# Patient Record
Sex: Female | Born: 1945 | Race: Black or African American | Hispanic: No | Marital: Single | Smoking: Never smoker
Health system: Southern US, Community
[De-identification: ages and names within clinical notes are randomized; demographics above are authoritative.]

## PROBLEM LIST (undated history)

## (undated) DIAGNOSIS — E119 Type 2 diabetes mellitus without complications: Secondary | ICD-10-CM

---

## 2017-08-17 ENCOUNTER — Encounter: Payer: Self-pay | Admitting: Neurology

## 2021-06-15 ENCOUNTER — Emergency Department (HOSPITAL_BASED_OUTPATIENT_CLINIC_OR_DEPARTMENT_OTHER): Payer: Medicare Other

## 2021-06-15 ENCOUNTER — Other Ambulatory Visit: Payer: Self-pay

## 2021-06-15 ENCOUNTER — Encounter (HOSPITAL_BASED_OUTPATIENT_CLINIC_OR_DEPARTMENT_OTHER): Payer: Self-pay | Admitting: Pediatrics

## 2021-06-15 ENCOUNTER — Emergency Department (HOSPITAL_BASED_OUTPATIENT_CLINIC_OR_DEPARTMENT_OTHER)
Admission: EM | Admit: 2021-06-15 | Discharge: 2021-06-15 | Disposition: A | Discharge status: Home / Self Care | Payer: Medicare Other | Attending: Emergency Medicine | Admitting: Emergency Medicine

## 2021-06-15 DIAGNOSIS — E119 Type 2 diabetes mellitus without complications: Secondary | ICD-10-CM | POA: Diagnosis not present

## 2021-06-15 DIAGNOSIS — R42 Dizziness and giddiness: Secondary | ICD-10-CM | POA: Insufficient documentation

## 2021-06-15 HISTORY — DX: Type 2 diabetes mellitus without complications: E11.9

## 2021-06-15 LAB — CBC WITH DIFFERENTIAL/PLATELET
Abs Immature Granulocytes: 0.02 10*3/uL (ref 0.00–0.07)
Basophils Absolute: 0 10*3/uL (ref 0.0–0.1)
Basophils Relative: 0 %
Eosinophils Absolute: 0.1 10*3/uL (ref 0.0–0.5)
Eosinophils Relative: 1 %
HCT: 41.7 % (ref 36.0–46.0)
Hemoglobin: 13.3 g/dL (ref 12.0–15.0)
Immature Granulocytes: 0 %
Lymphocytes Relative: 35 %
Lymphs Abs: 2.1 10*3/uL (ref 0.7–4.0)
MCH: 28 pg (ref 26.0–34.0)
MCHC: 31.9 g/dL (ref 30.0–36.0)
MCV: 87.8 fL (ref 80.0–100.0)
Monocytes Absolute: 0.3 10*3/uL (ref 0.1–1.0)
Monocytes Relative: 6 %
Neutro Abs: 3.4 10*3/uL (ref 1.7–7.7)
Neutrophils Relative %: 58 %
Platelets: 224 10*3/uL (ref 150–400)
RBC: 4.75 MIL/uL (ref 3.87–5.11)
RDW: 13.2 % (ref 11.5–15.5)
WBC: 5.9 10*3/uL (ref 4.0–10.5)
nRBC: 0 % (ref 0.0–0.2)

## 2021-06-15 LAB — BASIC METABOLIC PANEL
Anion gap: 12 (ref 5–15)
BUN: 19 mg/dL (ref 8–23)
CO2: 23 mmol/L (ref 22–32)
Calcium: 10 mg/dL (ref 8.9–10.3)
Chloride: 103 mmol/L (ref 98–111)
Creatinine, Ser: 0.78 mg/dL (ref 0.44–1.00)
GFR, Estimated: >60 mL/min (ref >60)
Glucose, Bld: 131 mg/dL — ABNORMAL HIGH (ref 70–99)
Potassium: 3.6 mmol/L (ref 3.5–5.1)
Sodium: 138 mmol/L (ref 135–145)

## 2021-06-15 MED ORDER — MECLIZINE HCL 25 MG PO TABS: 25.0000 mg | ORAL_TABLET | Freq: Two times a day (BID) | ORAL | 0 refills | Status: AC | PRN

## 2021-06-15 NOTE — ED Provider Notes (Signed)
[facility]   CSN: [id number] Arrival date & time: 06/15/21  1958     History  Chief Complaint  Patient presents with   Dizziness    Judith Alphonzo CruiseFoat is a 76 y.o. female.  2 episodes of brief vertigo type symptoms.  Resolved after doing Epley maneuver at home.  Asymptomatic now.  Sent by urgent care for further work-up.  Denies any chest pain or shortness of breath.  No vision changes.  No speech changes.  The history is provided by the patient.  Dizziness Quality:  Vertigo Severity:  Mild Progression:  Resolved Chronicity:  Recurrent Context: head movement   Relieved by:  Being still Worsened by:  Nothing Associated symptoms: no blood in stool, no chest pain, no diarrhea, no headaches, no hearing loss, no nausea, no palpitations, no shortness of breath, no syncope, no tinnitus, no vision changes, no vomiting and no weakness   Risk factors: hx of vertigo       Home Medications Prior to Admission medications   Medication Sig Start Date End Date Taking? Authorizing Provider  meclizine (ANTIVERT) 25 MG tablet Take 1 tablet (25 mg total) by mouth 2 (two) times daily as needed for up to 20 doses for dizziness. 06/15/21  Yes Adrienne Trombetta, DO      Allergies    Shellfish allergy    Review of Systems   Review of Systems  HENT:  Negative for hearing loss and tinnitus.   Respiratory:  Negative for shortness of breath.   Cardiovascular:  Negative for chest pain, palpitations and syncope.  Gastrointestinal:  Negative for blood in stool, diarrhea, nausea and vomiting.  Neurological:  Positive for dizziness. Negative for weakness and headaches.   Physical Exam Updated Vital Signs BP (!) 155/78    Pulse 87    Temp 97.8 F (36.6 C)    Resp 18    Ht 5\' 6"  (1.676 m)    Wt 95.3 kg    SpO2 100%    BMI 33.89 kg/m  Physical Exam Vitals and nursing note reviewed.  Constitutional:      General: She is not in acute distress.    Appearance: She  is well-developed. She is not ill-appearing.  HENT:     Head: Normocephalic and atraumatic.     Mouth/Throat:     Mouth: Mucous membranes are moist.  Eyes:     Extraocular Movements: Extraocular movements intact.     Conjunctiva/sclera: Conjunctivae normal.     Pupils: Pupils are equal, round, and reactive to light.  Cardiovascular:     Rate and Rhythm: Normal rate and regular rhythm.     Heart sounds: No murmur heard. Pulmonary:     Effort: Pulmonary effort is normal. No respiratory distress.     Breath sounds: Normal breath sounds.  Abdominal:     Palpations: Abdomen is soft.     Tenderness: There is no abdominal tenderness.  Musculoskeletal:        General: No swelling.     Cervical back: Normal range of motion and neck supple. No tenderness.  Skin:    General: Skin is warm and dry.     Capillary Refill: Capillary refill takes less than 2 seconds.  Neurological:     General: No focal deficit present.     Mental Status: She is alert and oriented to person, place, and time.     Cranial Nerves: No cranial nerve deficit.     Sensory: No sensory deficit.  Motor: No weakness.     Coordination: Coordination normal.     Gait: Gait normal.     Comments: 5+ out of 5 strength throughout, normal gait, normal sensation, no drift, normal finger-nose-finger, normal speech, no visual field deficit  Psychiatric:        Mood and Affect: Mood normal.    ED Results / Procedures / Treatments   Labs (all labs ordered are listed, but only abnormal results are displayed) Labs Reviewed  BASIC METABOLIC PANEL - Abnormal; Notable for the following components:      Result Value   Glucose, Bld 131 (*)    All other components within normal limits  CBC WITH DIFFERENTIAL/PLATELET    EKG None  Radiology CT Head Wo Contrast  Result Date: 06/15/2021 CLINICAL DATA:  Hypertensive emergency, dizziness EXAM: CT HEAD WITHOUT CONTRAST TECHNIQUE: Contiguous axial images were obtained from the base  of the skull through the vertex without intravenous contrast. RADIATION DOSE REDUCTION: This exam was performed according to the departmental dose-optimization program which includes automated exposure control, adjustment of the mA and/or kV according to patient size and/or use of iterative reconstruction technique. COMPARISON:  None. FINDINGS: Brain: There is no evidence of acute intracranial hemorrhage, extra-axial fluid collection, or acute infarct. Parenchymal volume is normal for age. The ventricles are normal in size. 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 There is no mass lesion.  There is no midline shift. Vascular: No hyperdense vessel or unexpected calcification. Skull: Normal. Negative for fracture or focal lesion. Sinuses/Orbits: The imaged paranasal sinuses are clear. The imaged globes and orbits are unremarkable. Other: None. IMPRESSION: No acute intracranial pathology. Electronically Signed   By: Valetta Mole M.D.   On: 06/15/2021 20:35    Procedures Procedures    Medications Ordered in ED Medications - No data to display  ED Course/ Medical Decision Making/ A&P                           Medical Decision Making Amount and/or Complexity of Data Reviewed Labs: ordered. Radiology: ordered.   Judith Murray is here after episodes of dizziness today that are now self resolved.  History of diabetes.  History of vertigo.  Normal vitals.  No fever.  Had 2 brief episodes of what she describes as dizzy feelings.  Resolved at rest.  Actually tried Epley maneuver that she thought helped as well.  Asymptomatic now.  Is sent for urgent care for further work-up.  Has had labs and CT scan of head while in triage.  Neuro exam is normal.  I have no concern for stroke.  Suspect may be peripheral vertigo.  Does not have chest pain or shortness of breath.  Have no concern for acute coronary syndrome  and pulmonary embolism or other cardiac or pulmonary process.  EKG per my review shows sinus rhythm.  No ischemic changes.  Lab work per my review and interpretation shows no significant anemia, electrolyte abnormality, kidney injury.  Head CT was ordered and per my review shows no head bleed and no acute findings.  This is confirmed the radiology report.  Overall she is is very well-appearing.  Will prescribe meclizine to use as needed.  Discharged in good condition.  This chart was dictated using voice recognition software.  Despite best efforts to proofread,  errors can occur which can change the documentation meaning.  Final Clinical Impression(s) / ED Diagnoses Final diagnoses:  Dizziness    Rx / DC Orders ED Discharge Orders          Ordered    meclizine (ANTIVERT) 25 MG tablet  2 times daily PRN        06/15/21 2128              Lennice Sites, DO 06/15/21 2131

## 2021-06-15 NOTE — ED Triage Notes (Signed)
Reported was seen at an UC earlier today w/ c/o dizziness that started this morning while going into the kitchen. Felt light headed and "spinning" like sensation; denies passing out, but felt near syncope and relieved by rest.

## 2022-07-05 ENCOUNTER — Encounter (HOSPITAL_BASED_OUTPATIENT_CLINIC_OR_DEPARTMENT_OTHER): Payer: Self-pay | Admitting: Emergency Medicine

## 2022-07-05 ENCOUNTER — Other Ambulatory Visit: Payer: Self-pay

## 2022-07-05 ENCOUNTER — Emergency Department (HOSPITAL_BASED_OUTPATIENT_CLINIC_OR_DEPARTMENT_OTHER)
Admission: EM | Admit: 2022-07-05 | Discharge: 2022-07-05 | Disposition: A | Discharge status: Home / Self Care | Payer: Medicare Other | Attending: Emergency Medicine | Admitting: Emergency Medicine

## 2022-07-05 DIAGNOSIS — R42 Dizziness and giddiness: Secondary | ICD-10-CM | POA: Diagnosis not present

## 2022-07-05 LAB — CBC
HCT: 38 % (ref 36.0–46.0)
Hemoglobin: 12.3 g/dL (ref 12.0–15.0)
MCH: 28.7 pg (ref 26.0–34.0)
MCHC: 32.4 g/dL (ref 30.0–36.0)
MCV: 88.8 fL (ref 80.0–100.0)
Platelets: 190 10*3/uL (ref 150–400)
RBC: 4.28 MIL/uL (ref 3.87–5.11)
RDW: 13.3 % (ref 11.5–15.5)
WBC: 5.5 10*3/uL (ref 4.0–10.5)
nRBC: 0 % (ref 0.0–0.2)

## 2022-07-05 LAB — URINALYSIS, ROUTINE W REFLEX MICROSCOPIC
Bilirubin Urine: NEGATIVE
Glucose, UA: NEGATIVE mg/dL
Hgb urine dipstick: NEGATIVE
Ketones, ur: NEGATIVE mg/dL
Leukocytes,Ua: NEGATIVE
Nitrite: NEGATIVE
Protein, ur: NEGATIVE mg/dL
Specific Gravity, Urine: 1.02 (ref 1.005–1.030)
pH: 6 (ref 5.0–8.0)

## 2022-07-05 LAB — BASIC METABOLIC PANEL
Anion gap: 8 (ref 5–15)
BUN: 17 mg/dL (ref 8–23)
CO2: 27 mmol/L (ref 22–32)
Calcium: 9.9 mg/dL (ref 8.9–10.3)
Chloride: 105 mmol/L (ref 98–111)
Creatinine, Ser: 0.86 mg/dL (ref 0.44–1.00)
GFR, Estimated: >60 mL/min (ref >60)
Glucose, Bld: 153 mg/dL — ABNORMAL HIGH (ref 70–99)
Potassium: 4.1 mmol/L (ref 3.5–5.1)
Sodium: 140 mmol/L (ref 135–145)

## 2022-07-05 LAB — CBG MONITORING, ED: Glucose-Capillary: 156 mg/dL — ABNORMAL HIGH (ref 70–99)

## 2022-07-05 NOTE — ED Triage Notes (Signed)
Pt via pov from home with lightheadedness this morning. Pt was formerly on glipizide and thinks she might need to be back on it. She states she had sugar last night and usually does not have sugar. Pt denies n/v or any other symptoms including gait or vision problems. Pt alert & oriented, nad noted.

## 2022-07-05 NOTE — ED Notes (Signed)
Pt verbalized understanding of d/c instructions, meds, and followup care. Denies questions. VSS, no distress noted. Steady gait to exit with all belongings. 

## 2022-07-05 NOTE — ED Provider Notes (Signed)
[facility] Provider Note   CSN: [id number] Arrival date & time: 07/05/22  1304     History  Chief Complaint  Patient presents with   Dizziness    Judith Murray is a 77 y.o. female presenting to ED with lightheadedness.  Reports she was awake moving around at home this morning, had finished breakfast, and became lightheaded for a few minutes.  This completely resolved.  Denies chest pain, palpitations, cough, fevers, chills.  This has happened before in the past once.  Denies hx of MI, coronary disease.  HPI     Home Medications Prior to Admission medications   Medication Sig Start Date End Date Taking? Authorizing Provider  meclizine (ANTIVERT) 25 MG tablet Take 1 tablet (25 mg total) by mouth 2 (two) times daily as needed for up to 20 doses for dizziness. 06/15/21   Curatolo, Adam, DO      Allergies    Shellfish allergy    Review of Systems   Review of Systems  Physical Exam Updated Vital Signs BP 137/70 (BP Location: Left Arm)   Pulse 69   Temp 97.7 F (36.5 C)   Resp 19   Ht 5' 6.25" (1.683 m)   Wt 91.9 kg   SpO2 98%   BMI 32.44 kg/m  Physical Exam Constitutional:      General: She is not in acute distress. HENT:     Head: Normocephalic and atraumatic.  Eyes:     Conjunctiva/sclera: Conjunctivae normal.     Pupils: Pupils are equal, round, and reactive to light.  Cardiovascular:     Rate and Rhythm: Normal rate and regular rhythm.  Pulmonary:     Effort: Pulmonary effort is normal. No respiratory distress.  Abdominal:     General: There is no distension.     Tenderness: There is no abdominal tenderness.  Skin:    General: Skin is warm and dry.  Neurological:     General: No focal deficit present.     Mental Status: She is alert. Mental status is at baseline.  Psychiatric:        Mood and Affect: Mood normal.        Behavior: Behavior normal.     ED Results / Procedures / Treatments   Labs (all labs  ordered are listed, but only abnormal results are displayed) Labs Reviewed  BASIC METABOLIC PANEL - Abnormal; Notable for the following components:      Result Value   Glucose, Bld 153 (*)    All other components within normal limits  CBG MONITORING, ED - Abnormal; Notable for the following components:   Glucose-Capillary 156 (*)    All other components within normal limits  CBC  URINALYSIS, ROUTINE W REFLEX MICROSCOPIC    EKG EKG Interpretation  Date/Time:  Monday July 05 2022 13:23:14 EST Ventricular Rate:  59 PR Interval:  178 QRS Duration: 74 QT Interval:  364 QTC Calculation: 360 R Axis:   9 Text Interpretation: Sinus bradycardia Nonspecific T wave abnormality Abnormal ECG When compared with ECG of 15-Jun-2021 20:30, Fusion complexes are no longer Present Nonspecific T wave abnormality now evident in Inferior leads Nonspecific T wave abnormality now evident in Lateral leads Confirmed by Regan Lemming ([id number]) on 07/05/2022 2:46:01 PM  Radiology No results found.  Procedures Procedures    Medications Ordered in ED Medications - No data to display  ED Course/ Medical Decision Making/ A&P  Medical Decision Making Amount and/or Complexity of Data Reviewed Labs: ordered.   This patient presents to the ED with concern for lightheadedness. This involves an extensive number of treatment options, and is a complaint that carries with it a high risk of complications and morbidity.  The differential diagnosis includes anemia vs arrhythmia vs dehydration vs orthostatic hypotension vs other  I ordered and personally interpreted labs.  The pertinent results include:  no emergent findings  The patient was maintained on a cardiac monitor.  I personally viewed and interpreted the cardiac monitored which showed an underlying rhythm of: NSR  Per my interpretation the patient's ECG shows NSR  I have reviewed the patients home medicines and have made  adjustments as needed  Test Considered: doubt acute PE, PNA, intrathoracic emergency, no indication for chest imaging; doubt CVA/stroke, no indication for LP or neuroimaging  After the interventions noted above, I reevaluated the patient and found that they have: improved  Doubt MI, arrhythmia.  No cardiac murmurs on exam - doubt severe valvular disease or carotid/vascular stenosis  Dispostion:  After consideration of the diagnostic results and the patients response to treatment, I feel that the patent would benefit from close outpatient follow up.         Final Clinical Impression(s) / ED Diagnoses Final diagnoses:  Lightheadedness    Rx / DC Orders ED Discharge Orders     None         Ashyah Quizon, Carola Rhine, MD 07/05/22 1513

## 2022-07-05 NOTE — Discharge Instructions (Addendum)
Please contact your primary care doctor's office to arrange for follow-up appointment.  You may want to discuss an echocardiogram with your doctor's office.   If you have recurring or worsening symptoms of lightheadedness, including chest pain or pressure, difficulty breathing, loss of consciousness, please call 911 and return to the ER immediately.

## 2022-12-20 ENCOUNTER — Other Ambulatory Visit: Payer: Self-pay

## 2022-12-20 ENCOUNTER — Emergency Department (HOSPITAL_BASED_OUTPATIENT_CLINIC_OR_DEPARTMENT_OTHER)
Admission: EM | Admit: 2022-12-20 | Discharge: 2022-12-20 | Discharge status: Against Medical Advice | Payer: Medicare Other | Attending: Emergency Medicine | Admitting: Emergency Medicine

## 2022-12-20 DIAGNOSIS — Z5321 Procedure and treatment not carried out due to patient leaving prior to being seen by health care provider: Secondary | ICD-10-CM | POA: Insufficient documentation

## 2022-12-20 DIAGNOSIS — M542 Cervicalgia: Secondary | ICD-10-CM | POA: Diagnosis not present

## 2022-12-20 DIAGNOSIS — S0990XA Unspecified injury of head, initial encounter: Secondary | ICD-10-CM | POA: Diagnosis present

## 2022-12-20 DIAGNOSIS — Y92410 Unspecified street and highway as the place of occurrence of the external cause: Secondary | ICD-10-CM | POA: Diagnosis not present
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
## 2022-12-20 NOTE — ED Notes (Signed)
Left per registration

## 2022-12-21 ENCOUNTER — Other Ambulatory Visit (HOSPITAL_BASED_OUTPATIENT_CLINIC_OR_DEPARTMENT_OTHER): Payer: Self-pay

## 2022-12-21 ENCOUNTER — Emergency Department (HOSPITAL_BASED_OUTPATIENT_CLINIC_OR_DEPARTMENT_OTHER)
Admission: EM | Admit: 2022-12-21 | Discharge: 2022-12-21 | Disposition: A | Discharge status: Home / Self Care | Payer: No Typology Code available for payment source | Attending: Emergency Medicine | Admitting: Emergency Medicine

## 2022-12-21 ENCOUNTER — Encounter (HOSPITAL_BASED_OUTPATIENT_CLINIC_OR_DEPARTMENT_OTHER): Payer: Self-pay | Admitting: Emergency Medicine

## 2022-12-21 ENCOUNTER — Other Ambulatory Visit: Payer: Self-pay

## 2022-12-21 ENCOUNTER — Emergency Department (HOSPITAL_BASED_OUTPATIENT_CLINIC_OR_DEPARTMENT_OTHER): Payer: No Typology Code available for payment source

## 2022-12-21 DIAGNOSIS — Y92410 Unspecified street and highway as the place of occurrence of the external cause: Secondary | ICD-10-CM | POA: Insufficient documentation

## 2022-12-21 DIAGNOSIS — S46811D Strain of other muscles, fascia and tendons at shoulder and upper arm level, right arm, subsequent encounter: Secondary | ICD-10-CM | POA: Insufficient documentation

## 2022-12-21 DIAGNOSIS — S0990XA Unspecified injury of head, initial encounter: Secondary | ICD-10-CM | POA: Diagnosis present

## 2022-12-21 DIAGNOSIS — S060X0A Concussion without loss of consciousness, initial encounter: Secondary | ICD-10-CM | POA: Diagnosis not present

## 2022-12-21 DIAGNOSIS — S161XXD Strain of muscle, fascia and tendon at neck level, subsequent encounter: Secondary | ICD-10-CM | POA: Insufficient documentation

## 2022-12-21 MED ORDER — LIDOCAINE 5 % EX PTCH
1.0000 | MEDICATED_PATCH | CUTANEOUS | 0 refills | Status: AC
  Filled 2022-12-21: qty 30, 30d supply, fill #0

## 2022-12-21 MED ORDER — LIDOCAINE 5 % EX PTCH
1.0000 | MEDICATED_PATCH | Freq: Once | CUTANEOUS | Status: DC
  Administered 2022-12-21: 3 via TRANSDERMAL
  Filled 2022-12-21: qty 3

## 2022-12-21 MED ORDER — ACETAMINOPHEN 500 MG PO TABS
1000.0000 mg | ORAL_TABLET | Freq: Once | ORAL | Status: AC
  Administered 2022-12-21: 1000 mg via ORAL
  Filled 2022-12-21: qty 2
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
## 2022-12-21 NOTE — Discharge Instructions (Signed)
Seen in the emergency department for your headache and your neck pain.  Your head CT and neck CT did not show any signs of bleeding in your brain or broken bones and you likely have a concussion as well as pulled muscles in your neck and shoulder that are spasming.  You can continue to take Tylenol every 6 hours as needed for pain as well as use the lidocaine patches or heat.  You should follow-up with your primary doctor and continue physical therapy and I have also given you concussion clinic to follow-up with.  You should return to the emergency department if you have numbness or weakness on one side of your body compared to the other, repetitive vomiting, seizures or any other new or concerning symptoms.

## 2022-12-21 NOTE — ED Notes (Signed)
Discharge instructions, follow up care, pain management and prescription reviewed and explained, pt verbalized understanding and had no further questions on d/c. Pt caox4 and ambulatory on d/c.

## 2023-05-30 ENCOUNTER — Encounter (HOSPITAL_BASED_OUTPATIENT_CLINIC_OR_DEPARTMENT_OTHER): Payer: Self-pay | Admitting: Emergency Medicine

## 2023-05-30 ENCOUNTER — Other Ambulatory Visit: Payer: Self-pay

## 2023-05-30 ENCOUNTER — Emergency Department (HOSPITAL_BASED_OUTPATIENT_CLINIC_OR_DEPARTMENT_OTHER): Payer: Medicare Other

## 2023-05-30 ENCOUNTER — Emergency Department (HOSPITAL_BASED_OUTPATIENT_CLINIC_OR_DEPARTMENT_OTHER)
Admission: EM | Admit: 2023-05-30 | Discharge: 2023-05-30 | Disposition: A | Discharge status: Home / Self Care | Payer: Medicare Other | Attending: Emergency Medicine | Admitting: Emergency Medicine

## 2023-05-30 DIAGNOSIS — R030 Elevated blood-pressure reading, without diagnosis of hypertension: Secondary | ICD-10-CM

## 2023-05-30 DIAGNOSIS — R519 Headache, unspecified: Secondary | ICD-10-CM | POA: Insufficient documentation

## 2023-05-30 MED ORDER — ACETAMINOPHEN 500 MG PO TABS
1000.0000 mg | ORAL_TABLET | Freq: Once | ORAL | Status: AC
  Administered 2023-05-30: 1000 mg via ORAL
  Filled 2023-05-30: qty 2
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
## 2023-05-30 NOTE — Discharge Instructions (Signed)
It was our pleasure to provide your ER care today - we hope that you feel better.  Your ct scan looks good - no acute process is noted.   Drink plenty of fluids/stay well hydrated.   Take acetaminophen, ibuprofen, or excedrin as need.   Follow up with primary care doctor in 1-2 weeks. Also have your doctor recheck your blood pressure as it is mildly high today.  Return to ER if worse, new symptoms, fevers, severe pain, persistent vomiting, or other concern.

## 2023-09-07 ENCOUNTER — Ambulatory Visit (HOSPITAL_COMMUNITY): Admission: EM | Admit: 2023-09-07 | Discharge: 2023-09-07 | Disposition: A | Discharge status: Home / Self Care

## 2023-10-29 IMAGING — CT CT HEAD W/O CM
4 series · 16 of 47 positions shown, 18 images · non-contrast
Comparison: None.

CLINICAL DATA: Hypertensive emergency, dizziness



[Series 2: head bone · axial · 0.41mm/px · z∈[+898,+930]mm · 3 of 78 slices shown]
[im 8/78  bone]
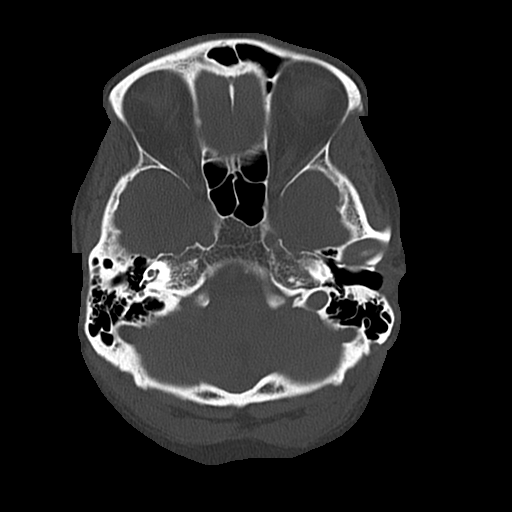
[im 16/78  bone]
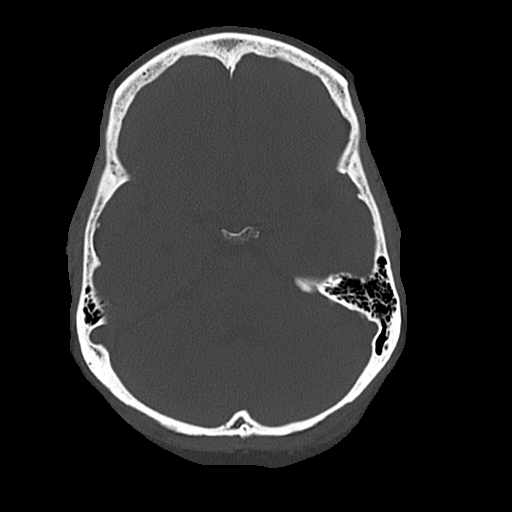
[im 24/78  bone]
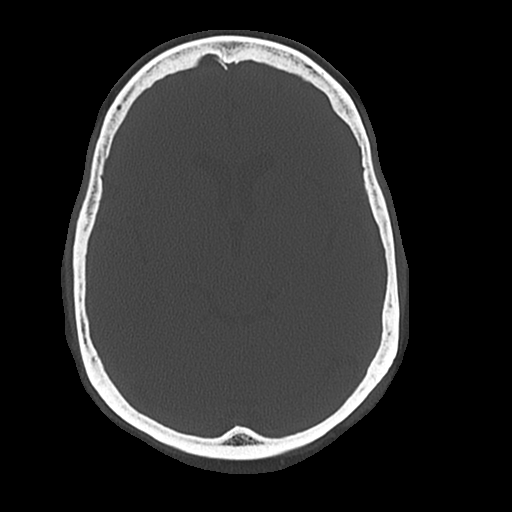

[Series 3: head wo · axial · 0.41mm/px · z∈[+899,+1014]mm · 7 of 31 slices shown, 9 images]
[im 4/31  brain]
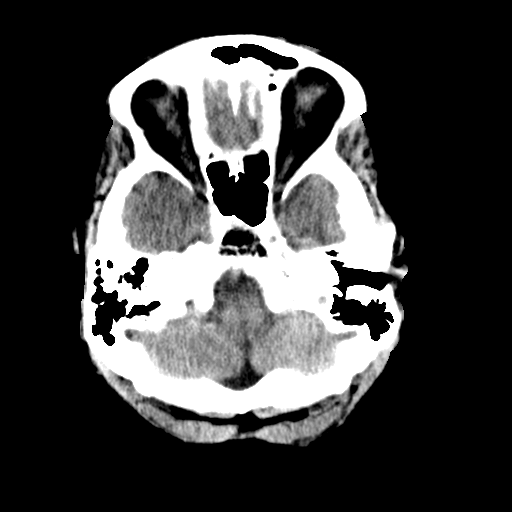
[im 4/31  bone]
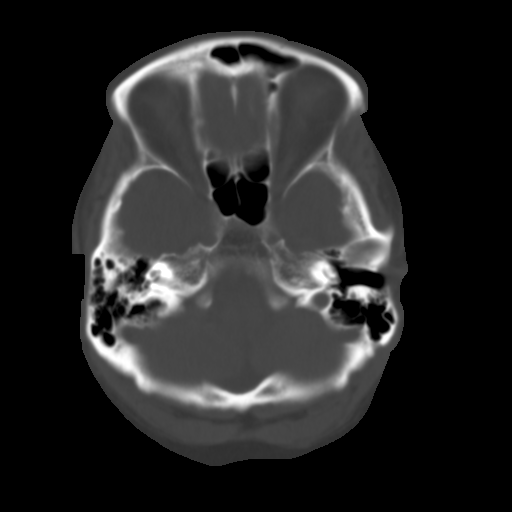
[im 8/31  brain]
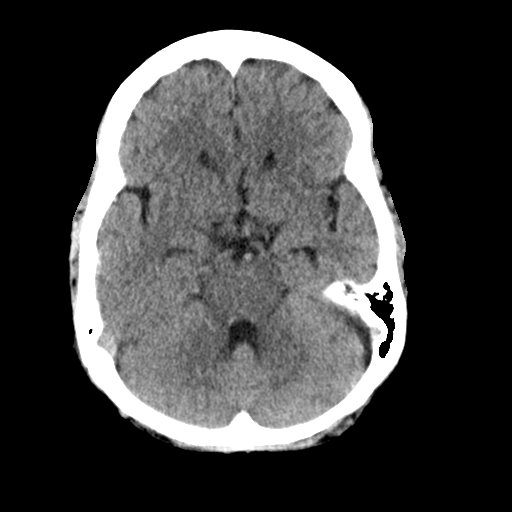
[im 12/31  brain]
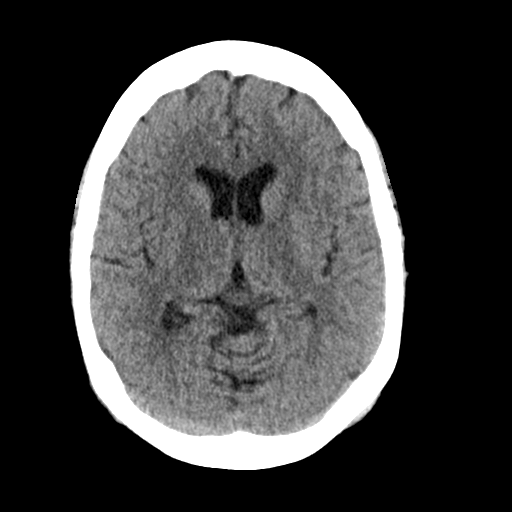
[im 16/31  brain]
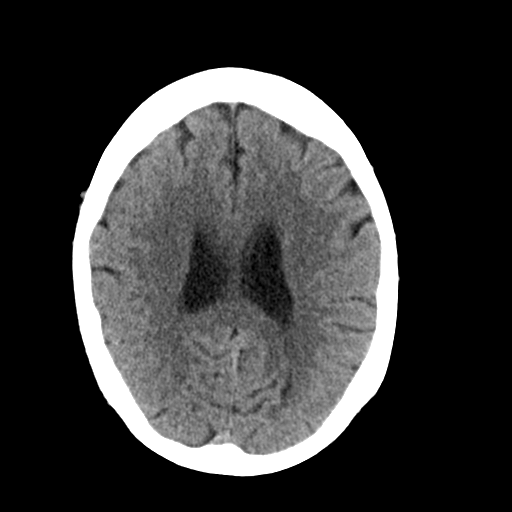
[im 19/31  brain]
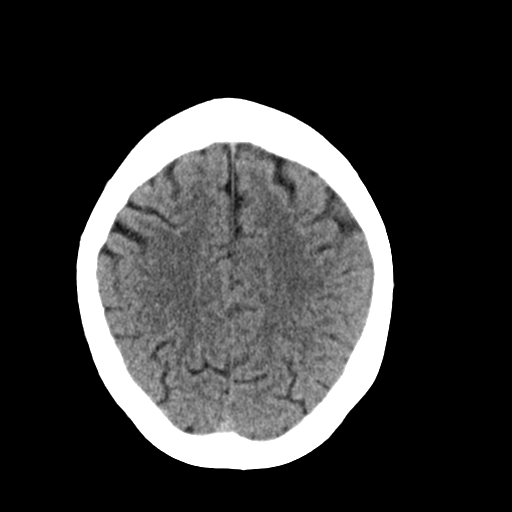
[im 19/31  bone]
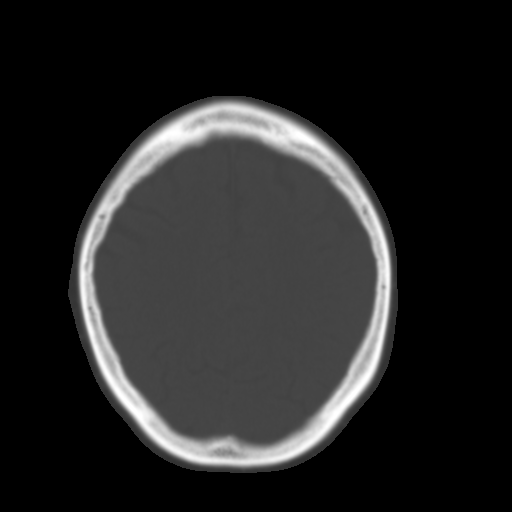
[im 23/31  brain]
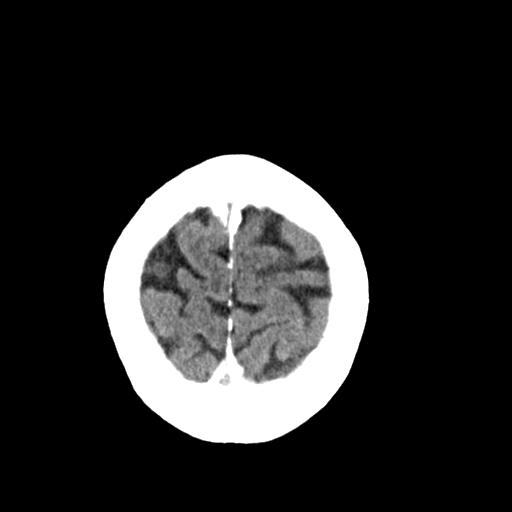
[im 27/31  brain]
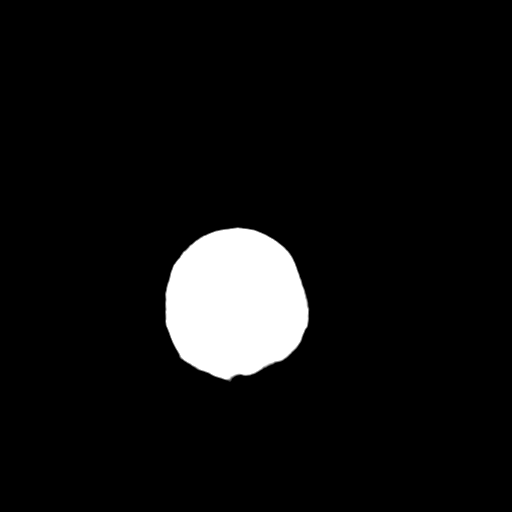

[Series 4: coronal soft · coronal · 0.28mm/px · 3 of 64 slices shown]
[im 22/64  brain]
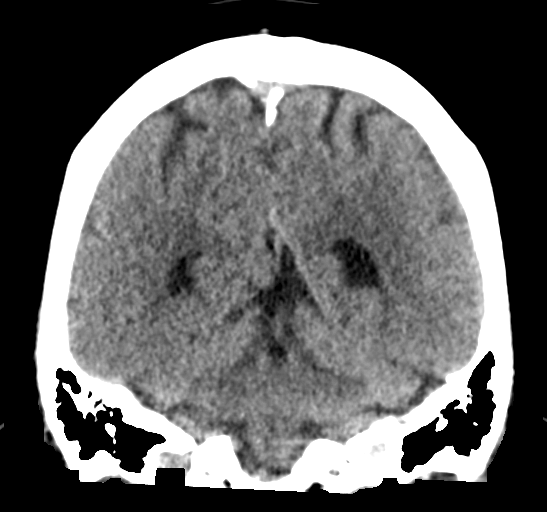
[im 29/64  brain]
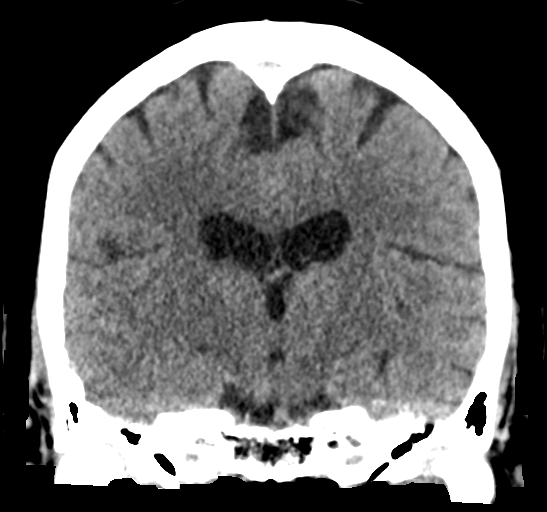
[im 36/64  brain]
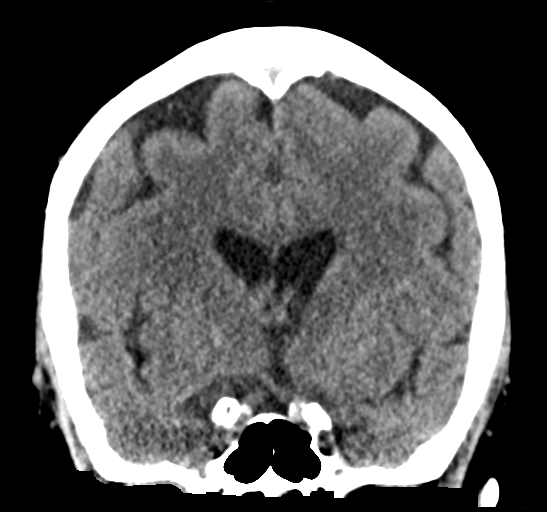

[Series 5: sagittal soft · sagittal · 0.28mm/px · 3 of 52 slices shown]
[im 18/52  brain]
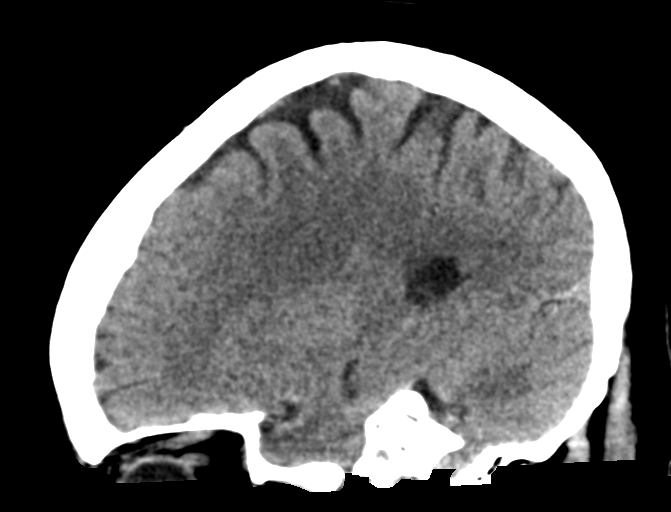
[im 26/52  brain]
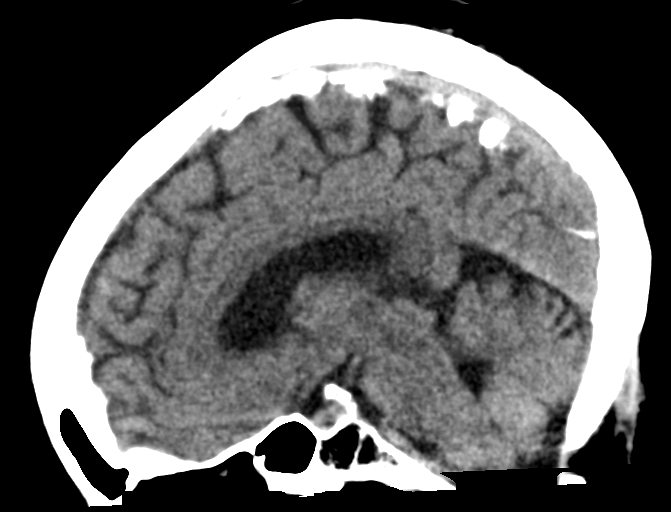
[im 35/52  brain]
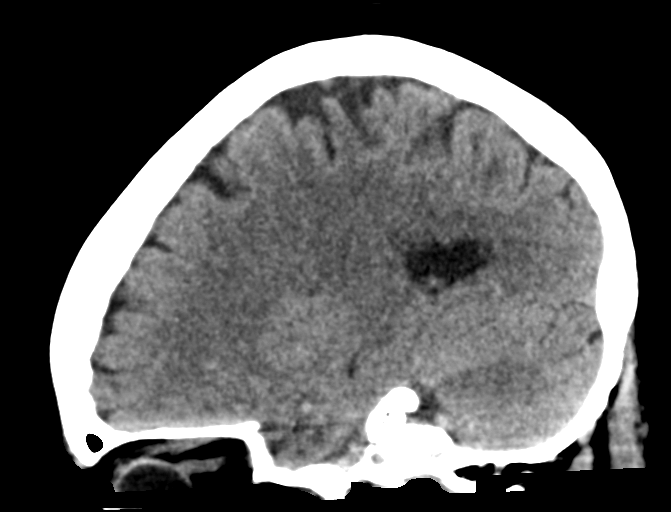

[16 of 47 positions shown; findings below may reference images not displayed]

FINDINGS: Brain: There is no evidence of acute intracranial hemorrhage,
extra-axial fluid collection, or acute infarct.

Parenchymal volume is normal for age. The ventricles are normal in
size. Small foci of hypodensity in the subcortical and
periventricular white matter likely reflects sequela of mild chronic
white matter microangiopathy.

There is no mass lesion.  There is no midline shift.

Vascular: No hyperdense vessel or unexpected calcification.

Skull: Normal. Negative for fracture or focal lesion.

Sinuses/Orbits: The imaged paranasal sinuses are clear. The imaged
globes and orbits are unremarkable.

Other: None.
IMPRESSION: No acute intracranial pathology.

## 2023-12-19 ENCOUNTER — Other Ambulatory Visit: Payer: Self-pay

## 2023-12-19 ENCOUNTER — Encounter (HOSPITAL_BASED_OUTPATIENT_CLINIC_OR_DEPARTMENT_OTHER): Payer: Self-pay | Admitting: Emergency Medicine

## 2023-12-19 ENCOUNTER — Emergency Department (HOSPITAL_BASED_OUTPATIENT_CLINIC_OR_DEPARTMENT_OTHER)
Admission: EM | Admit: 2023-12-19 | Discharge: 2023-12-19 | Discharge status: Against Medical Advice | Attending: Emergency Medicine | Admitting: Emergency Medicine

## 2023-12-19 ENCOUNTER — Emergency Department (HOSPITAL_BASED_OUTPATIENT_CLINIC_OR_DEPARTMENT_OTHER)

## 2023-12-19 DIAGNOSIS — Z5329 Procedure and treatment not carried out because of patient's decision for other reasons: Secondary | ICD-10-CM | POA: Diagnosis not present

## 2023-12-19 DIAGNOSIS — R519 Headache, unspecified: Secondary | ICD-10-CM | POA: Diagnosis present

## 2023-12-19 LAB — CBC WITH DIFFERENTIAL/PLATELET
Abs Immature Granulocytes: 0.01 K/uL (ref 0.00–0.07)
Basophils Absolute: 0 K/uL (ref 0.0–0.1)
Basophils Relative: 0 %
Eosinophils Absolute: 0 K/uL (ref 0.0–0.5)
Eosinophils Relative: 1 %
HCT: 39.8 % (ref 36.0–46.0)
Hemoglobin: 13 g/dL (ref 12.0–15.0)
Immature Granulocytes: 0 %
Lymphocytes Relative: 37 %
Lymphs Abs: 2.3 K/uL (ref 0.7–4.0)
MCH: 28.8 pg (ref 26.0–34.0)
MCHC: 32.7 g/dL (ref 30.0–36.0)
MCV: 88.1 fL (ref 80.0–100.0)
Monocytes Absolute: 0.3 K/uL (ref 0.1–1.0)
Monocytes Relative: 5 %
Neutro Abs: 3.5 K/uL (ref 1.7–7.7)
Neutrophils Relative %: 57 %
Platelets: 194 K/uL (ref 150–400)
RBC: 4.52 MIL/uL (ref 3.87–5.11)
RDW: 13.5 % (ref 11.5–15.5)
WBC: 6.1 K/uL (ref 4.0–10.5)
nRBC: 0 % (ref 0.0–0.2)

## 2023-12-19 LAB — COMPREHENSIVE METABOLIC PANEL WITH GFR
ALT: 22 U/L (ref 0–44)
AST: 27 U/L (ref 15–41)
Albumin: 4.4 g/dL (ref 3.5–5.0)
Alkaline Phosphatase: 113 U/L (ref 38–126)
Anion gap: 13 (ref 5–15)
BUN: 16 mg/dL (ref 8–23)
CO2: 22 mmol/L (ref 22–32)
Calcium: 9.8 mg/dL (ref 8.9–10.3)
Chloride: 102 mmol/L (ref 98–111)
Creatinine, Ser: 0.85 mg/dL (ref 0.44–1.00)
GFR, Estimated: >60 mL/min (ref >60)
Glucose, Bld: 225 mg/dL — ABNORMAL HIGH (ref 70–99)
Potassium: 4 mmol/L (ref 3.5–5.1)
Sodium: 137 mmol/L (ref 135–145)
Total Bilirubin: 0.7 mg/dL (ref 0.0–1.2)
Total Protein: 7.6 g/dL (ref 6.5–8.1)

## 2023-12-19 LAB — CBG MONITORING, ED: Glucose-Capillary: 232 mg/dL — ABNORMAL HIGH (ref 70–99)

## 2023-12-19 MED ORDER — DIPHENHYDRAMINE HCL 50 MG/ML IJ SOLN
12.5000 mg | Freq: Once | INTRAMUSCULAR | Status: AC
  Administered 2023-12-19: 12.5 mg via INTRAVENOUS
  Filled 2023-12-19: qty 1

## 2023-12-19 MED ORDER — PROCHLORPERAZINE EDISYLATE 10 MG/2ML IJ SOLN
10.0000 mg | Freq: Once | INTRAMUSCULAR | Status: AC
  Administered 2023-12-19: 10 mg via INTRAVENOUS
  Filled 2023-12-19: qty 2
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
## 2023-12-19 NOTE — ED Notes (Signed)
 Patient states not wanting to wait any long .  Give the risks of leaving up to and including death.  PA spoke with patient and still wanting to leave.  PIV removed and AMA papers signed.

## 2023-12-19 NOTE — ED Provider Notes (Signed)
 [place] EMERGENCY DEPARTMENT AT [facility] Provider Note   CSN: [id number] Arrival date & time: 12/19/23  1421     Patient presents with: No chief complaint on file.   Judith Murray is a 78 y.o. female.   Patient with right sided, sharp constant headache that woke her from sleep last night. She denies history of migraine or CVA. No fever, congestion, rash. No congestion, ear pain or hearing changes. No nausea, vomiting. The pain goes from the front right to the occipital right and into the neck. She took Tylenol  without relief.   The history is provided by the patient. No language interpreter was used.       Prior to Admission medications   Medication Sig Start Date End Date Taking? Authorizing Provider  lidocaine  (LIDODERM ) 5 % Place 1 patch onto the skin daily. Remove & Discard patch within 12 hours or as directed by MD 12/21/22   Kingsley, Victoria K, DO  meclizine  (ANTIVERT ) 25 MG tablet Take 1 tablet (25 mg total) by mouth 2 (two) times daily as needed for up to 20 doses for dizziness. 06/15/21   Curatolo, Adam, DO  mirabegron ER (MYRBETRIQ) 25 MG TB24 tablet Take 1 tablet by mouth daily. 09/04/21   [provider]    Allergies: Shellfish allergy    Review of Systems  Updated Vital Signs BP (!) 176/99 (BP Location: Right Arm)   Pulse 70   Temp 98.1 F (36.7 C) (Oral)   Resp 17   SpO2 98%   Physical Exam Vitals and nursing note reviewed.  Constitutional:      General: She is not in acute distress.    Appearance: Normal appearance. She is well-developed. She is not ill-appearing.  HENT:     Head: Normocephalic and atraumatic.  Eyes:     Pupils: Pupils are equal, round, and reactive to light.  Cardiovascular:     Rate and Rhythm: Normal rate and regular rhythm.     Heart sounds: No murmur heard. Pulmonary:     Effort: Pulmonary effort is normal.     Breath sounds: Normal breath sounds. No wheezing, rhonchi or rales.  Abdominal:      Palpations: Abdomen is soft.     Tenderness: There is no abdominal tenderness. There is no guarding or rebound.  Musculoskeletal:        General: Normal range of motion.     Cervical back: Normal range of motion and neck supple. No rigidity.  Skin:    General: Skin is warm and dry.     Comments: No redness or rash to scalp, neck or face. Right scalp tender to palpation.  Neurological:     General: No focal deficit present.     Mental Status: She is alert and oriented to person, place, and time.     GCS: GCS eye subscore is 4. GCS verbal subscore is 5. GCS motor subscore is 6.     Cranial Nerves: Cranial nerves 2-12 are intact. No dysarthria or facial asymmetry.     Sensory: No sensory deficit.     Motor: No weakness or abnormal muscle tone.     Gait: Gait is intact.     (all labs ordered are listed, but only abnormal results are displayed) Labs Reviewed  COMPREHENSIVE METABOLIC PANEL WITH GFR - Abnormal; Notable for the following components:      Result Value   Glucose, Bld 225 (*)    All other components within normal limits  CBG MONITORING, ED -  Abnormal; Notable for the following components:   Glucose-Capillary 232 (*)    All other components within normal limits  CBC WITH DIFFERENTIAL/PLATELET    EKG: None  Radiology: No results found.   Procedures   Medications Ordered in the ED  prochlorperazine  (COMPAZINE ) injection 10 mg (10 mg Intravenous Given 12/19/23 1503)  diphenhydrAMINE  (BENADRYL ) injection 12.5 mg (12.5 mg Intravenous Given 12/19/23 1502)                                    Medical Decision Making This patient presents to the ED for concern of severe headache, this involves an extensive number of treatment options, and is a complaint that carries with it a high risk of complications and morbidity.  The differential diagnosis includes SDH/SAH, CVA, Temporal arteritis, sinus headache, pre-rash shingles   Co morbidities that complicate the patient  evaluation  T2DM   Additional history obtained:  Additional history and/or information obtained from chart review, notable for n/a   Lab Tests:  I Ordered, and personally interpreted labs.  The pertinent results include:   Results for orders placed or performed during the hospital encounter of 12/19/23 -CBG monitoring, ED:  Collection Time: 12/19/23  2:31 PM      Result                      Value             Ref Range          Glucose-Capillary           232 (H)           70 - 99 mg/dL -CBC with Differential:  Collection Time: 12/19/23  3:07 PM      Result                      Value             Ref Range          WBC                         6.1               4.0 - 10.5 K*      RBC                         4.52              3.87 - 5.11 *      Hemoglobin                  13.0              12.0 - 15.0 *      HCT                         39.8              36.0 - 46.0 %      MCV                         88.1              80.0 - 100.0[facility]  28.8              26.0 - 34.0 *      MCHC                        32.7              30.0 - 36.0 *      RDW                         13.5              11.5 - 15.5 %      Platelets                   194               150 - 400 K/*      nRBC                        0.0               0.0 - 0.2 %        Neutrophils Relative %      57                %                  Neutro Abs                  3.5               1.7 - 7.7 K/*      Lymphocytes Relative        37                %                  Lymphs Abs                  2.3               0.7 - 4.0 K/*      Monocytes Relative          5                 %                  Monocytes Absolute          0.3               0.1 - 1.0 K/*      Eosinophils Relative        1                 %                  Eosinophils Absolute        0.0               0.0 - 0.5 K/*      Basophils Relative          0                 %                  Basophils Absolute          0.0  0.0 -  0.1 K/*      Immature Granulocytes       0                 %                  Abs Immature Granulocy*     0.01              0.00 - 0.07 * -Comprehensive metabolic panel:  Collection Time: 12/19/23  3:07 PM      Result                      Value             Ref Range          Sodium                      137               135 - 145 mm*      Potassium                   4.0               3.5 - 5.1 mm*      Chloride                    102               98 - 111 mmo*      CO2                         22                22 - 32 mmol*      Glucose, Bld                225 (H)           70 - 99 mg/dL      BUN                         16                8 - 23 mg/dL       Creatinine, Ser             0.85              0.44 - 1.00 *      Calcium                     9.8               8.9 - 10.3 m*      Total Protein               7.6               6.5 - 8.1 g/*      Albumin                     4.4               3.5 - 5.0 g/*      AST  27                15 - 41 U/L        ALT                         22                0 - 44 U/L         Alkaline Phosphatase        113               38 - 126 U/L       Total Bilirubin             0.7               0.0 - 1.2 mg*      GFR, Estimated              >60               >60 mL/min         Anion gap                   13                5 - 15            Imaging Studies ordered:  I ordered imaging studies including CTA head and neck I independently visualized and interpreted imaging which showed  - patient signed out AMA prior to study being done.     Cardiac Monitoring:  The patient was maintained on a cardiac monitor.  I personally viewed and interpreted the cardiac monitored which showed an underlying rhythm of: n/a   Medicines ordered and prescription drug management:  I ordered medication including compazine , benadryl   for headache Reevaluation of the patient after these medicines showed that the patient improved I have reviewed  the patients home medicines and have made adjustments as needed   Test Considered:  N/a   Critical Interventions:  N/a   Consultations Obtained:  I requested consultation with the n/a,  and discussed lab and imaging findings as well as pertinent plan - they recommend: n/a   Problem List / ED Course:  Here with onset severe headache last night, constant, right sided No neurologic deficits on exam Concern for unilateral headache with no prior history CTA head and neck ordered  16:15 - patient is not willing to wait in the ED until CTA performed. She states she feels she has been here too long and does not want to wait. Discussed that serious or life threatening conditions cannot be ruled out and her safety in discharge was undetermined. She acknowledges understanding and is felt able to make her own care decisions. She is welcomed to return at any time if she changes her mind.    Reevaluation:  After the interventions noted above, I reevaluated the patient and found that they have :improved   Social Determinants of Health:  Never a smoker   Disposition:  After consideration of the diagnostic results and the patients response to treatment, I feel that the patient would benefit from [facility] .   Amount and/or Complexity of Data Reviewed Labs: ordered. Radiology: ordered.  Risk Prescription drug management.        Final diagnoses:  Acute nonintractable headache, unspecified headache type    ED Discharge Orders     None  Odell Balls, PA-C 12/19/23 1619    Tegeler, Lonni PARAS, MD 12/20/23 [phone]

## 2023-12-19 NOTE — Discharge Instructions (Signed)
 As we discussed, you have stated you do not wish to wait for the studies that were ordered in evaluation of your severe headache. You have chosen to leave against medical advice before determining if this condition is serious or life-threatening and that you understand the risks of being discharged.   Please return to the ED at any time you wish further evaluation or treatment.
